# Patient Record
Sex: Female | Born: 1980 | Race: White | Hispanic: No | Marital: Married | Smoking: Never smoker
Health system: Southern US, Community
[De-identification: ages and names within clinical notes are randomized; demographics above are authoritative.]

## PROBLEM LIST (undated history)

## (undated) DIAGNOSIS — Z789 Other specified health status: Secondary | ICD-10-CM

## (undated) HISTORY — PX: NO PAST SURGERIES: SHX2092

---
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
## 2006-03-18 ENCOUNTER — Emergency Department (HOSPITAL_COMMUNITY): Admission: EM | Admit: 2006-03-18 | Discharge: 2006-03-18 | Payer: Self-pay | Admitting: Emergency Medicine

## 2007-06-10 ENCOUNTER — Emergency Department (HOSPITAL_COMMUNITY): Admission: EM | Admit: 2007-06-10 | Discharge: 2007-06-10 | Payer: Self-pay | Admitting: Emergency Medicine

## 2010-11-13 ENCOUNTER — Other Ambulatory Visit: Payer: Self-pay | Admitting: Obstetrics and Gynecology

## 2010-11-13 ENCOUNTER — Other Ambulatory Visit (HOSPITAL_COMMUNITY)
Admission: RE | Admit: 2010-11-13 | Discharge: 2010-11-13 | Disposition: A | Discharge status: Home / Self Care | Payer: BC Managed Care – PPO | Source: Ambulatory Visit | Attending: Obstetrics and Gynecology | Admitting: Obstetrics and Gynecology

## 2010-11-13 DIAGNOSIS — Z124 Encounter for screening for malignant neoplasm of cervix: Secondary | ICD-10-CM | POA: Insufficient documentation

## 2011-11-15 ENCOUNTER — Other Ambulatory Visit (HOSPITAL_COMMUNITY)
Admission: RE | Admit: 2011-11-15 | Discharge: 2011-11-15 | Disposition: A | Discharge status: Home / Self Care | Payer: BC Managed Care – PPO | Source: Ambulatory Visit | Attending: Obstetrics and Gynecology | Admitting: Obstetrics and Gynecology

## 2011-11-15 ENCOUNTER — Other Ambulatory Visit: Payer: Self-pay | Admitting: Obstetrics and Gynecology

## 2011-11-15 DIAGNOSIS — Z124 Encounter for screening for malignant neoplasm of cervix: Secondary | ICD-10-CM | POA: Insufficient documentation

## 2011-11-15 DIAGNOSIS — Z1151 Encounter for screening for human papillomavirus (HPV): Secondary | ICD-10-CM | POA: Insufficient documentation

## 2013-12-25 LAB — OB RESULTS CONSOLE RUBELLA ANTIBODY, IGM: RUBELLA: IMMUNE

## 2013-12-25 LAB — OB RESULTS CONSOLE ABO/RH: RH TYPE: POSITIVE

## 2013-12-25 LAB — OB RESULTS CONSOLE HIV ANTIBODY (ROUTINE TESTING): HIV: NONREACTIVE

## 2013-12-25 LAB — OB RESULTS CONSOLE RPR: RPR: NONREACTIVE

## 2013-12-25 LAB — OB RESULTS CONSOLE GC/CHLAMYDIA
CHLAMYDIA, DNA PROBE: NEGATIVE
GC PROBE AMP, GENITAL: NEGATIVE

## 2013-12-25 LAB — OB RESULTS CONSOLE ANTIBODY SCREEN: Antibody Screen: NEGATIVE

## 2013-12-25 LAB — OB RESULTS CONSOLE HEPATITIS B SURFACE ANTIGEN: Hepatitis B Surface Ag: NEGATIVE

## 2014-07-30 ENCOUNTER — Inpatient Hospital Stay (HOSPITAL_COMMUNITY)
Admission: AD | Admit: 2014-07-30 | Discharge: 2014-08-01 | DRG: 775 | Disposition: A | Discharge status: Home / Self Care | Payer: BLUE CROSS/BLUE SHIELD | Source: Ambulatory Visit | Attending: Obstetrics and Gynecology | Admitting: Obstetrics and Gynecology

## 2014-07-30 ENCOUNTER — Encounter (HOSPITAL_COMMUNITY): Payer: Self-pay

## 2014-07-30 ENCOUNTER — Inpatient Hospital Stay (HOSPITAL_COMMUNITY): Payer: BLUE CROSS/BLUE SHIELD | Admitting: Anesthesiology

## 2014-07-30 DIAGNOSIS — Z3A40 40 weeks gestation of pregnancy: Secondary | ICD-10-CM | POA: Diagnosis present

## 2014-07-30 HISTORY — DX: Other specified health status: Z78.9

## 2014-07-30 LAB — GLUCOSE, CAPILLARY: GLUCOSE-CAPILLARY: 99 mg/dL (ref 65–99)

## 2014-07-30 LAB — TYPE AND SCREEN
ABO/RH(D): B POS
ANTIBODY SCREEN: NEGATIVE

## 2014-07-30 LAB — CBC
HCT: 33.8 % — ABNORMAL LOW (ref 36.0–46.0)
Hemoglobin: 11.4 g/dL — ABNORMAL LOW (ref 12.0–15.0)
MCH: 29.9 pg (ref 26.0–34.0)
MCHC: 33.7 g/dL (ref 30.0–36.0)
MCV: 88.7 fL (ref 78.0–100.0)
Platelets: 305 10*3/uL (ref 150–400)
RBC: 3.81 MIL/uL — ABNORMAL LOW (ref 3.87–5.11)
RDW: 13.5 % (ref 11.5–15.5)
WBC: 15.4 10*3/uL — ABNORMAL HIGH (ref 4.0–10.5)

## 2014-07-30 LAB — RPR: RPR: NONREACTIVE

## 2014-07-30 LAB — ABO/RH: ABO/RH(D): B POS

## 2014-07-30 LAB — OB RESULTS CONSOLE GBS: GBS: NEGATIVE

## 2014-07-30 MED ORDER — MEDROXYPROGESTERONE ACETATE 150 MG/ML IM SUSP: 150.0000 mg | INTRAMUSCULAR | Status: DC | PRN

## 2014-07-30 MED ORDER — PRENATAL MULTIVITAMIN CH
1.0000 | ORAL_TABLET | Freq: Every day | ORAL | Status: DC
  Administered 2014-07-31 – 2014-08-01 (×2): 1 via ORAL
  Filled 2014-07-30 (×2): qty 1

## 2014-07-30 MED ORDER — OXYTOCIN BOLUS FROM INFUSION
500.0000 mL | INTRAVENOUS | Status: DC
Start: 2014-07-30 — End: 2014-07-30
  Administered 2014-07-30: 500 mL via INTRAVENOUS

## 2014-07-30 MED ORDER — EPHEDRINE 5 MG/ML INJ
10.0000 mg | INTRAVENOUS | Status: DC | PRN
  Filled 2014-07-30: qty 2

## 2014-07-30 MED ORDER — TETANUS-DIPHTH-ACELL PERTUSSIS 5-2.5-18.5 LF-MCG/0.5 IM SUSP: 0.5000 mL | Freq: Once | INTRAMUSCULAR | Status: DC

## 2014-07-30 MED ORDER — CITRIC ACID-SODIUM CITRATE 334-500 MG/5ML PO SOLN: 30.0000 mL | ORAL | Status: DC | PRN

## 2014-07-30 MED ORDER — LIDOCAINE HCL (PF) 1 % IJ SOLN
INTRAMUSCULAR | Status: DC | PRN
  Administered 2014-07-30 (×2): 9 mL

## 2014-07-30 MED ORDER — DIPHENHYDRAMINE HCL 50 MG/ML IJ SOLN: 12.5000 mg | INTRAMUSCULAR | Status: DC | PRN

## 2014-07-30 MED ORDER — ONDANSETRON HCL 4 MG/2ML IJ SOLN: 4.0000 mg | Freq: Four times a day (QID) | INTRAMUSCULAR | Status: DC | PRN

## 2014-07-30 MED ORDER — ACETAMINOPHEN 325 MG PO TABS: 650.0000 mg | ORAL_TABLET | ORAL | Status: DC | PRN

## 2014-07-30 MED ORDER — OXYTOCIN 40 UNITS IN LACTATED RINGERS INFUSION - SIMPLE MED
62.5000 mL/h | INTRAVENOUS | Status: DC
Start: 2014-07-30 — End: 2014-07-30
  Filled 2014-07-30: qty 1000

## 2014-07-30 MED ORDER — OXYCODONE-ACETAMINOPHEN 5-325 MG PO TABS: 1.0000 | ORAL_TABLET | ORAL | Status: DC | PRN

## 2014-07-30 MED ORDER — BENZOCAINE-MENTHOL 20-0.5 % EX AERO
1.0000 "application " | INHALATION_SPRAY | CUTANEOUS | Status: DC | PRN
  Administered 2014-07-30: 1 via TOPICAL
  Filled 2014-07-30: qty 56

## 2014-07-30 MED ORDER — LACTATED RINGERS IV SOLN
INTRAVENOUS | Status: DC
  Administered 2014-07-30 (×2): via INTRAVENOUS

## 2014-07-30 MED ORDER — FLEET ENEMA 7-19 GM/118ML RE ENEM: 1.0000 | ENEMA | RECTAL | Status: DC | PRN

## 2014-07-30 MED ORDER — LACTATED RINGERS IV SOLN: 500.0000 mL | INTRAVENOUS | Status: DC | PRN

## 2014-07-30 MED ORDER — LIDOCAINE HCL (PF) 1 % IJ SOLN
30.0000 mL | INTRAMUSCULAR | Status: DC | PRN
  Filled 2014-07-30: qty 30

## 2014-07-30 MED ORDER — ONDANSETRON HCL 4 MG PO TABS
4.0000 mg | ORAL_TABLET | ORAL | Status: DC | PRN
  Administered 2014-08-01: 4 mg via ORAL
  Filled 2014-07-30: qty 1

## 2014-07-30 MED ORDER — SIMETHICONE 80 MG PO CHEW: 80.0000 mg | CHEWABLE_TABLET | ORAL | Status: DC | PRN

## 2014-07-30 MED ORDER — ONDANSETRON HCL 4 MG/2ML IJ SOLN: 4.0000 mg | INTRAMUSCULAR | Status: DC | PRN

## 2014-07-30 MED ORDER — OXYCODONE-ACETAMINOPHEN 5-325 MG PO TABS: 2.0000 | ORAL_TABLET | ORAL | Status: DC | PRN

## 2014-07-30 MED ORDER — PHENYLEPHRINE 40 MCG/ML (10ML) SYRINGE FOR IV PUSH (FOR BLOOD PRESSURE SUPPORT)
80.0000 ug | PREFILLED_SYRINGE | INTRAVENOUS | Status: DC | PRN
  Filled 2014-07-30: qty 2
  Filled 2014-07-30: qty 20

## 2014-07-30 MED ORDER — SENNOSIDES-DOCUSATE SODIUM 8.6-50 MG PO TABS
2.0000 | ORAL_TABLET | ORAL | Status: DC
  Administered 2014-07-31 (×2): 2 via ORAL
  Filled 2014-07-30 (×2): qty 2

## 2014-07-30 MED ORDER — LANOLIN HYDROUS EX OINT: TOPICAL_OINTMENT | CUTANEOUS | Status: DC | PRN

## 2014-07-30 MED ORDER — IBUPROFEN 600 MG PO TABS
600.0000 mg | ORAL_TABLET | Freq: Four times a day (QID) | ORAL | Status: DC
  Administered 2014-07-30 – 2014-08-01 (×7): 600 mg via ORAL
  Filled 2014-07-30 (×7): qty 1

## 2014-07-30 MED ORDER — MEASLES, MUMPS & RUBELLA VAC ~~LOC~~ INJ
0.5000 mL | INJECTION | Freq: Once | SUBCUTANEOUS | Status: DC
  Filled 2014-07-30: qty 0.5

## 2014-07-30 MED ORDER — ZOLPIDEM TARTRATE 5 MG PO TABS: 5.0000 mg | ORAL_TABLET | Freq: Every evening | ORAL | Status: DC | PRN

## 2014-07-30 MED ORDER — FENTANYL 2.5 MCG/ML BUPIVACAINE 1/10 % EPIDURAL INFUSION (WH - ANES)
14.0000 mL/h | INTRAMUSCULAR | Status: DC | PRN
Start: 2014-07-30 — End: 2014-07-30
  Administered 2014-07-30: 14 mL/h via EPIDURAL
  Filled 2014-07-30: qty 125

## 2014-07-30 MED ORDER — WITCH HAZEL-GLYCERIN EX PADS: 1.0000 "application " | MEDICATED_PAD | CUTANEOUS | Status: DC | PRN

## 2014-07-30 MED ORDER — DIPHENHYDRAMINE HCL 25 MG PO CAPS: 25.0000 mg | ORAL_CAPSULE | Freq: Four times a day (QID) | ORAL | Status: DC | PRN

## 2014-07-30 MED ORDER — DIBUCAINE 1 % RE OINT: 1.0000 "application " | TOPICAL_OINTMENT | RECTAL | Status: DC | PRN

## 2014-07-30 NOTE — Progress Notes (Signed)
Pt feeling increased pressure  FHT cat 1 Toco Q2 Cvx c/c/+1  A/P:  Will start pushing

## 2014-07-30 NOTE — MAU Note (Signed)
Contractions tonight. Denies vaginal bleeding. Cervix was 1 cm last week.  Leaking fluid; started on the way to the hospital but isn't sure if it's urine.

## 2014-07-30 NOTE — Anesthesia Procedure Notes (Signed)
Epidural Patient location during procedure: OB Start time: 07/30/2014 8:55 AM End time: 07/30/2014 8:59 AM  Staffing Anesthesiologist: Leilani AbleHATCHETT, Terran Hollenkamp Performed by: anesthesiologist   Preanesthetic Checklist Completed: patient identified, surgical consent, pre-op evaluation, timeout performed, IV checked, risks and benefits discussed and monitors and equipment checked  Epidural Patient position: sitting Prep: site prepped and draped and DuraPrep Patient monitoring: continuous pulse ox and blood pressure Approach: midline Location: L3-L4 Injection technique: LOR air  Needle:  Needle type: Tuohy  Needle gauge: 17 G Needle length: 9 cm and 9 Needle insertion depth: 5 cm cm Catheter type: closed end flexible Catheter size: 19 Gauge Catheter at skin depth: 10 cm Test dose: negative and Other  Assessment Sensory level: T9 Events: blood not aspirated, injection not painful, no injection resistance, negative IV test and no paresthesia  Additional Notes Reason for block:procedure for pain

## 2014-07-30 NOTE — H&P (Signed)
Lisa Morse is a 34 y.o. female G1P0 @ 40+1 presenting for labor.  Pregnancy uncomplicated.    History OB History    Gravida Para Term Preterm AB TAB SAB Ectopic Multiple Living   1              Past Medical History  Diagnosis Date  . Medical history non-contributory    Past Surgical History  Procedure Laterality Date  . Wisdom tooth extraction  2000  . No past surgeries     Family History: family history is not on file. Social History:  has no tobacco, alcohol, and drug history on file.   Prenatal Transfer Tool  Maternal Diabetes: No Genetic Screening: Normal Maternal Ultrasounds/Referrals: Normal Fetal Ultrasounds or other Referrals:  None Maternal Substance Abuse:  No Significant Maternal Medications:  None Significant Maternal Lab Results:  None Other Comments:  None  ROS  Dilation: 5 Effacement (%): 90 Station: -2 Exam by:: Dr Renaldo FiddlerAdkins Blood pressure 121/75, pulse 76, temperature 98 F (36.7 C), temperature source Oral, resp. rate 18, height 5' 6.5" (1.689 m), weight 149 lb 6.4 oz (67.767 kg). Exam Physical Exam  Gen- uncomfortable w/ ctx Abd - gravid, NT  EFW 7# Ext - NT Cvx 5/90/-2 AROM - clear Prenatal labs: ABO, Rh: --/--/B POS (07/08 0200) Antibody: NEG (07/08 0200) Rubella: Immune (12/04 0000) RPR: Nonreactive (12/04 0000)  HBsAg: Negative (12/04 0000)  HIV: Non-reactive (12/04 0000)  GBS: Negative (07/08 0000)   Assessment/Plan: Labor Exp mngt   Lisa Morse 07/30/2014, 8:02 AM

## 2014-07-30 NOTE — Progress Notes (Signed)
SVD of vigorous female infant w/ apgars of 8,9.  Placenta delivered spontaneous w/ 3VC.   2nd degree midline epis repaired w/ 3-0 vicryl rapide.  Fundus firm.  EBL pending rn eval .  Mom & baby stable - couplet care

## 2014-07-30 NOTE — Anesthesia Preprocedure Evaluation (Signed)

## 2014-07-31 LAB — CBC
HCT: 28.6 % — ABNORMAL LOW (ref 36.0–46.0)
Hemoglobin: 9.6 g/dL — ABNORMAL LOW (ref 12.0–15.0)
MCH: 30 pg (ref 26.0–34.0)
MCHC: 33.6 g/dL (ref 30.0–36.0)
MCV: 89.4 fL (ref 78.0–100.0)
Platelets: 241 10*3/uL (ref 150–400)
RBC: 3.2 MIL/uL — AB (ref 3.87–5.11)
RDW: 13.6 % (ref 11.5–15.5)
WBC: 19.7 10*3/uL — ABNORMAL HIGH (ref 4.0–10.5)

## 2014-07-31 NOTE — Progress Notes (Signed)
Post Partum Day 1 Subjective: no complaints  Objective: Blood pressure 107/67, pulse 71, temperature 98.5 F (36.9 C), temperature source Oral, resp. rate 18, height 5' 6.5" (1.689 m), weight 149 lb 6.4 oz (67.767 kg), SpO2 100 %, unknown if currently breastfeeding.  Physical Exam:  General: alert and cooperative Lochia: appropriate Uterine Fundus: firm Incision: n/a DVT Evaluation: No evidence of DVT seen on physical exam.   Recent Labs  07/30/14 0200 07/31/14 0515  HGB 11.4* 9.6*  HCT 33.8* 28.6*    Assessment/Plan: Plan for discharge tomorrow, Breastfeeding and Lactation consult   LOS: 1 day   Lisa Morse 07/31/2014, 8:28 AM

## 2014-07-31 NOTE — Anesthesia Postprocedure Evaluation (Signed)
  Anesthesia Post-op Note  Patient: Lisa Morse  Procedure(s) Performed: * No procedures listed *  Patient Location: PACU and Mother/Baby  Anesthesia Type:Epidural  Level of Consciousness: awake, alert  and oriented  Airway and Oxygen Therapy: Patient Spontanous Breathing  Post-op Pain: none  Post-op Assessment: Post-op Vital signs reviewed and Patient's Cardiovascular Status Stable              Post-op Vital Signs: Reviewed and stable  Last Vitals:  Filed Vitals:   07/31/14 0523  BP: 107/67  Pulse: 71  Temp: 36.9 C  Resp: 18    Complications: No apparent anesthesia complications

## 2014-07-31 NOTE — Lactation Note (Signed)
This note was copied from the chart of Lisa Morse. Lactation Consultation Note: Initial visit with Mom. She reports baby fed about 1 hour ago for 5 min after her bath. Attempted to latch but baby too sleepy at this time. Skin to skin with mom. Reports she had good feeding at 7 am- baby latched easily. No pain with latch. Mom has been to BF class and wants to follow the Baby Wise book. Encouraged to watch for feeding cues and feed whenever she sees them, Discussed cluster feeding and encouraged to take a nap this afternoon. BF brochure given with resources for support after DC. No further questions at present. To call for assist prn  Patient Name: Lisa Morse [id number]'IToday's Date: 07/31/2014 Reason for consult: Initial assessment   Maternal Data Formula Feeding for Exclusion: No Does the patient have breastfeeding experience prior to this delivery?: No  Feeding   LATCH Score/Interventions Latch: Too sleepy or reluctant, no latch achieved, no sucking elicited.  Audible Swallowing: None Intervention(s): Skin to skin;Hand expression  Type of Nipple: Everted at rest and after stimulation  Comfort (Breast/Nipple): Soft / non-tender     Hold (Positioning): Assistance needed to correctly position infant at breast and maintain latch. Intervention(s): Breastfeeding basics reviewed;Position options;Support Pillows  LATCH Score: 5  Lactation Tools Discussed/Used     Consult Status Consult Status: Follow-up Date: 08/01/14 Follow-up type: In-patient    Pamelia HoitWeeks, Dhillon [place] D 07/31/2014, 12:30 PM

## 2014-08-01 MED ORDER — IBUPROFEN 600 MG PO TABS: 600.0000 mg | ORAL_TABLET | Freq: Four times a day (QID) | ORAL | Status: AC

## 2014-08-01 NOTE — Lactation Note (Signed)
This note was copied from the chart of Lisa Lura EmFarren [place]. Lactation Consultation Note  Patient Name: Lisa Morse [id number]'s Date: 08/01/2014 Reason for consult: Follow-up assessment;Breast/nipple pain;Other (Comment) (some nipple tenderness )  Baby is 45 hours old and has been to the breast consistently . Breast feeding Range - 10 -45 mins, Voids and stools  Adequate for age. Bili check - @ 33 hours - 6.6. @ this consult baby awake and hungry , latched with depth, showed mom how to ease chin  Gently downward to obtain more depth. Dad also was shown how he could help mom at home. Multiply swallows noted, increased with breast compressions.  Mom and dad seemed amazed at all the swallows. LC pointed out the consistent pattern .  Per mom nipples tender , no breakdown noted on either nipple, LC instructed mom on the use of comfort gels , hand pump, and shells as prevention  Measure against soreness, Also reviewed prevention and tx of engorgement . Referring to pages 24 in the Baby and me booklet. Mother informed of post-discharge support and given phone number to the lactation department, including services for phone call assistance; out-patient appointments; and breastfeeding support group. List of other breastfeeding resources in the community given in the handout. Encouraged mother to call for problems or concerns related to breastfeeding. Per mom has a DEBP at home.    Maternal Data Has patient been taught Hand Expression?: Yes  Feeding Feeding Type: Breast Fed  LATCH Score/Interventions Latch: Grasps breast easily, tongue down, lips flanged, rhythmical sucking. Intervention(s): Skin to skin;Teach feeding cues;Waking techniques Intervention(s): Adjust position;Assist with latch;Breast massage;Breast compression  Audible Swallowing: Spontaneous and intermittent  Type of Nipple: Everted at rest and after stimulation  Comfort (Breast/Nipple): Filling, red/small blisters or bruises,  mild/mod discomfort  Problem noted: Mild/Moderate discomfort;Filling  Hold (Positioning): No assistance needed to correctly position infant at breast. Intervention(s): Breastfeeding basics reviewed;Support Pillows;Position options;Skin to skin  LATCH Score: 9  Lactation Tools Discussed/Used Tools: Pump;Shells;Comfort gels Breast pump type: Manual WIC Program: No Pump Review: Setup, frequency, and cleaning;Milk Storage Initiated by:: MAI  Date initiated:: 08/01/14   Consult Status Consult Status: Complete Date: 08/01/14    [name], Angelique Chevalier Ann 08/01/2014, 11:43 AM

## 2014-08-01 NOTE — Discharge Summary (Signed)
Obstetric Discharge Summary Reason for Admission: onset of labor Prenatal Procedures: ultrasound Intrapartum Procedures: spontaneous vaginal delivery Postpartum Procedures: none Complications-Operative and Postpartum: 2nd degree perineal laceration HEMOGLOBIN  Date Value Ref Range Status  07/31/2014 9.6* 12.0 - 15.0 g/dL Final   HCT  Date Value Ref Range Status  07/31/2014 28.6* 36.0 - 46.0 % Final    Physical Exam:  General: alert and cooperative Lochia: appropriate Uterine Fundus: firm Incision: n/a DVT Evaluation: No evidence of DVT seen on physical exam.  Discharge Diagnoses: Term Pregnancy-delivered  Discharge Information: Date: 08/01/2014 Activity: pelvic rest Diet: routine Medications: PNV and Ibuprofen Condition: stable Instructions: refer to practice specific booklet Discharge to: home Follow-up Information    Schedule an appointment as soon as possible for a visit in 6 weeks to follow up.      Newborn Data: Live born female  Birth Weight: 7 lb 8.6 oz (3419 g) APGAR: 8, 9  Home with mother.  Sevin Farone 08/01/2014, 7:36 AM

## 2018-01-29 ENCOUNTER — Other Ambulatory Visit: Payer: Self-pay | Admitting: Obstetrics and Gynecology

## 2018-01-29 DIAGNOSIS — R928 Other abnormal and inconclusive findings on diagnostic imaging of breast: Secondary | ICD-10-CM

## 2018-01-31 ENCOUNTER — Ambulatory Visit
Admission: RE | Admit: 2018-01-31 | Discharge: 2018-01-31 | Disposition: A | Discharge status: Home / Self Care | Payer: 59 | Source: Ambulatory Visit | Attending: Obstetrics and Gynecology | Admitting: Obstetrics and Gynecology

## 2018-01-31 ENCOUNTER — Ambulatory Visit
Admission: RE | Admit: 2018-01-31 | Discharge: 2018-01-31 | Disposition: A | Discharge status: Home / Self Care | Payer: BLUE CROSS/BLUE SHIELD | Source: Ambulatory Visit | Attending: Obstetrics and Gynecology | Admitting: Obstetrics and Gynecology

## 2018-01-31 DIAGNOSIS — R928 Other abnormal and inconclusive findings on diagnostic imaging of breast: Secondary | ICD-10-CM

## 2019-06-25 IMAGING — MG DIGITAL DIAGNOSTIC UNILATERAL LEFT MAMMOGRAM WITH TOMO AND CAD
4 series · 4 of 12 positions shown · non-contrast
Comparison: Previous exam(s).

CLINICAL DATA: Left breast asymmetry seen on baseline screening
mammography. Family history of breast cancer in mother and
grandmother.

EXAM:
DIGITAL DIAGNOSTIC LEFT MAMMOGRAM WITH CAD AND TOMO
ULTRASOUND LEFT BREAST

[L CC synth-2D]
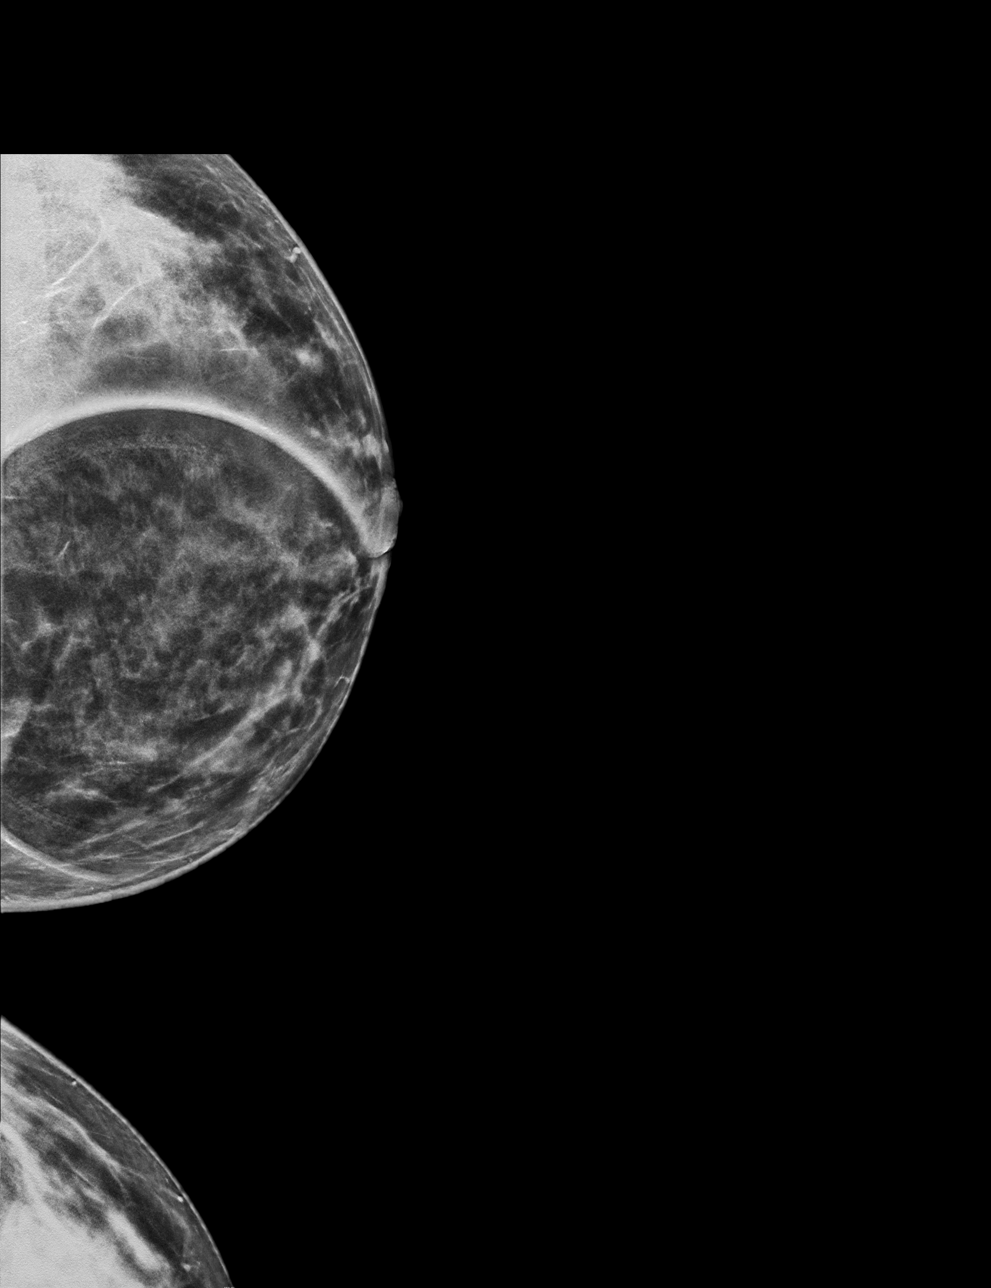

[L MLO synth-2D]
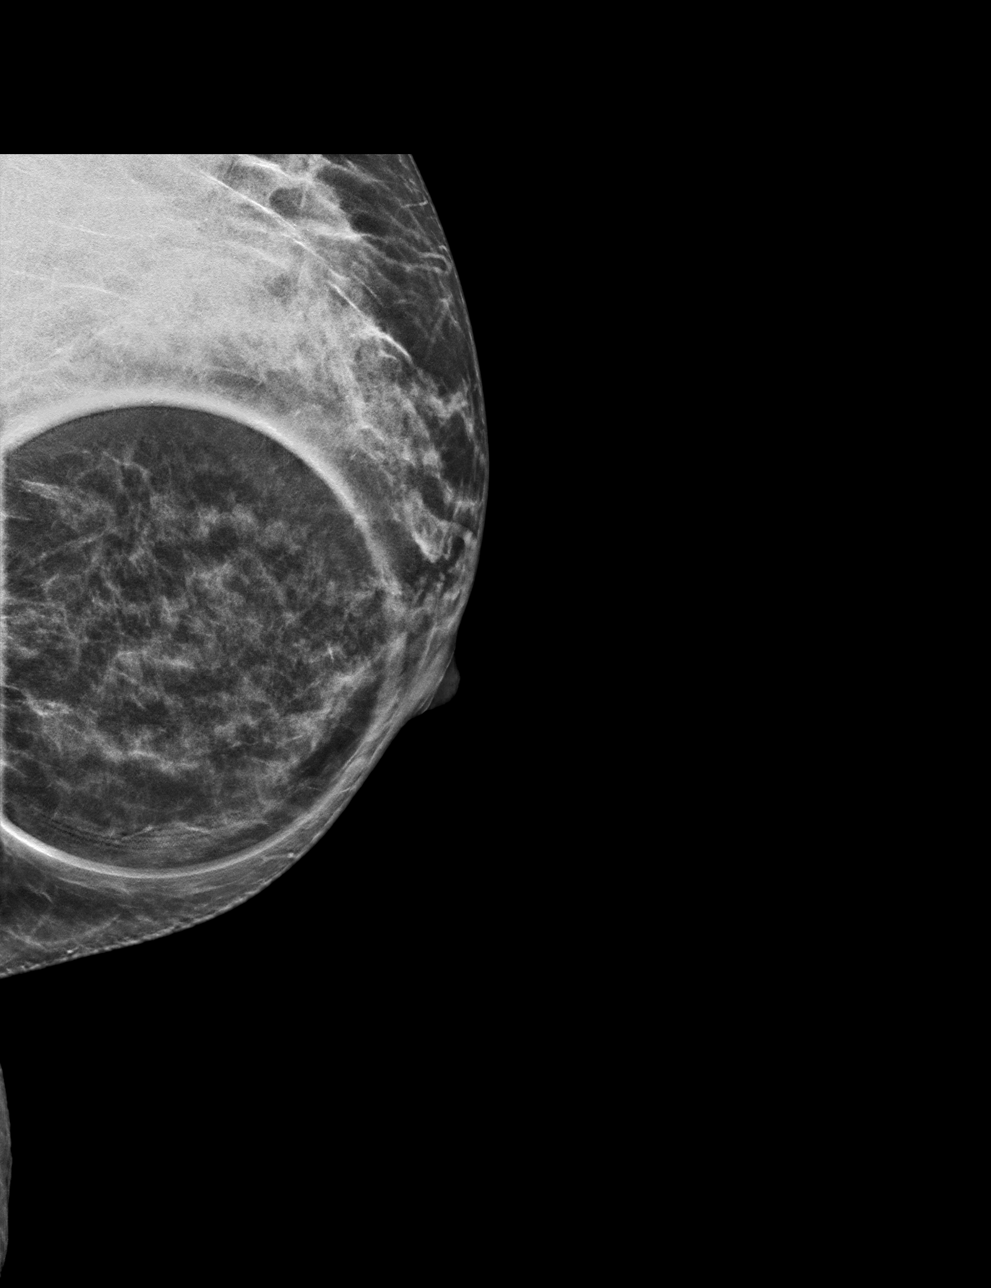

[L CC tomo · tomo slice 27/54.0]
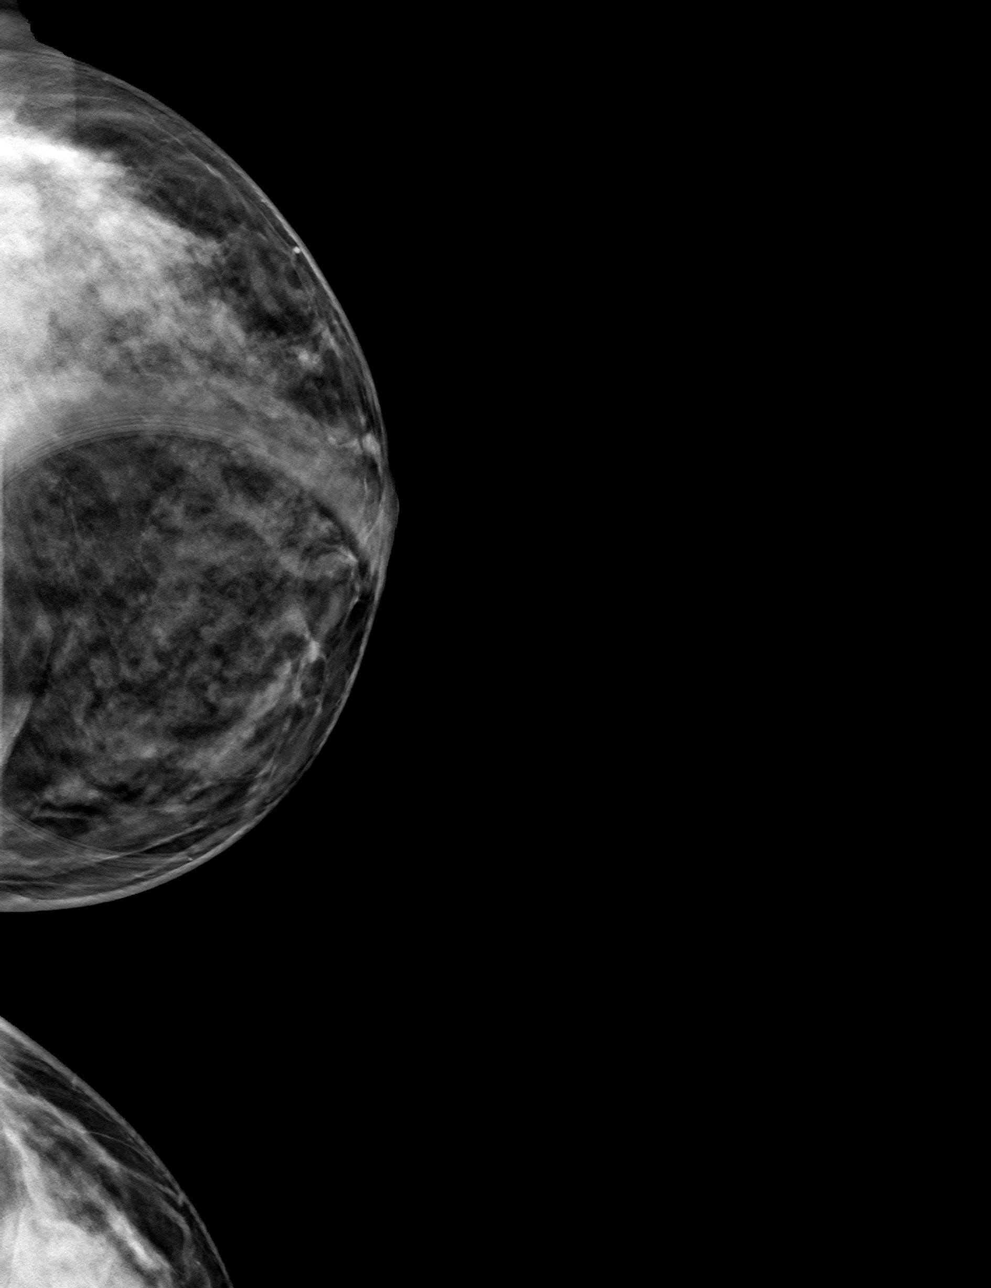

[L MLO tomo · tomo slice 23/45.0]
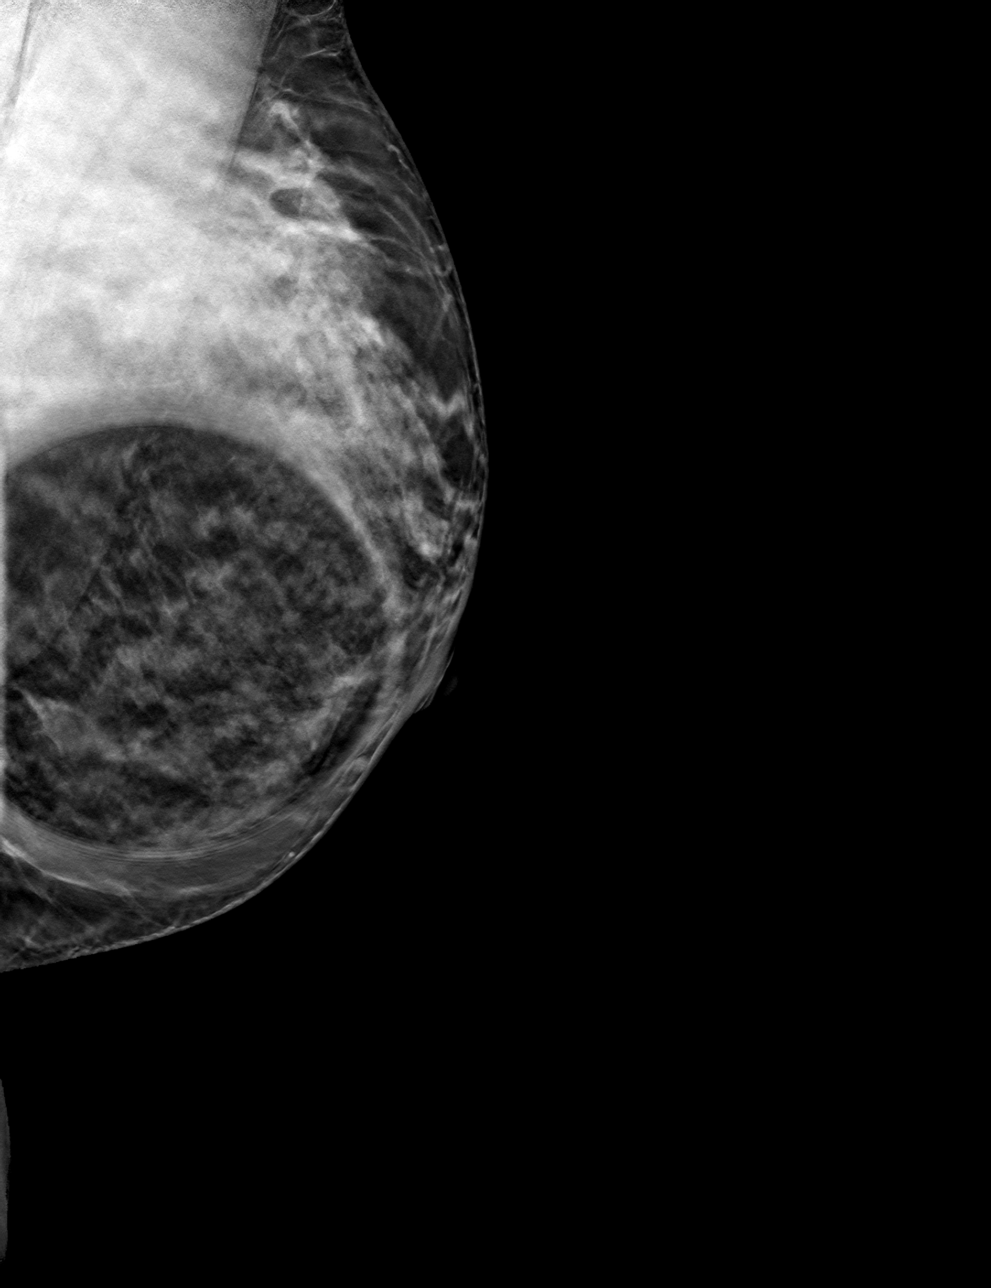

[4 of 12 positions shown; findings below may reference images not displayed]

ACR Breast Density Category c: The breast tissue is heterogeneously
dense, which may obscure small masses.
FINDINGS: Additional mammographic views of the left breast demonstrate no
suspicious masses or areas of architectural distortion. The
previously questioned asymmetry effaces to glandular tissue.

Mammographic images were processed with CAD.

On physical exam, no suspicious masses are palpated.

Targeted ultrasound is performed, showing no suspicious masses or
shadowing lesions.
IMPRESSION: No mammographic or sonographic evidence of malignancy in the left
breast.

RECOMMENDATION:
Screening mammogram in one year.(Code:TI-J-15X)

Based on the recommendations of the [employer], annual
screening MRI is suggested in addition to annual mammography if the
patient has an estimated lifetime risk of developing breast cancer
which is greater than 20%.

I have discussed the findings and recommendations with the patient.
Results were also provided in writing at the conclusion of the
visit. If applicable, a reminder letter will be sent to the patient
regarding the next appointment.

BI-RADS CATEGORY  1: Negative.

## 2020-09-02 ENCOUNTER — Other Ambulatory Visit: Payer: Self-pay | Admitting: Sports Medicine

## 2020-09-02 DIAGNOSIS — M79652 Pain in left thigh: Secondary | ICD-10-CM

## 2020-09-02 DIAGNOSIS — M25552 Pain in left hip: Secondary | ICD-10-CM

## 2020-09-02 DIAGNOSIS — R1084 Generalized abdominal pain: Secondary | ICD-10-CM
